# Patient Record
Sex: Male | Born: 1970 | Smoking: Current every day smoker
Health system: Southern US, Community
[De-identification: ages and names within clinical notes are randomized; demographics above are authoritative.]

## PROBLEM LIST (undated history)

## (undated) DIAGNOSIS — W3400XA Accidental discharge from unspecified firearms or gun, initial encounter: Secondary | ICD-10-CM

## (undated) HISTORY — PX: ABDOMINAL SURGERY: SHX537

---

## 2016-10-04 ENCOUNTER — Emergency Department (HOSPITAL_COMMUNITY)
Admission: EM | Admit: 2016-10-04 | Discharge: 2016-10-04 | Disposition: A | Discharge status: Home / Self Care | Payer: Self-pay | Attending: Emergency Medicine | Admitting: Emergency Medicine

## 2016-10-04 ENCOUNTER — Encounter (HOSPITAL_COMMUNITY): Payer: Self-pay | Admitting: Emergency Medicine

## 2016-10-04 DIAGNOSIS — M25561 Pain in right knee: Secondary | ICD-10-CM | POA: Insufficient documentation

## 2016-10-04 DIAGNOSIS — G8929 Other chronic pain: Secondary | ICD-10-CM | POA: Insufficient documentation
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
## 2016-10-04 NOTE — ED Triage Notes (Signed)
Patient here from home with complaints of right knee pain. Reports that he had x-rays done in [place], no fractures. I think I have "fluid" on the knee.

## 2017-01-06 ENCOUNTER — Other Ambulatory Visit: Payer: Self-pay

## 2017-01-06 ENCOUNTER — Emergency Department (HOSPITAL_COMMUNITY): Payer: Self-pay

## 2017-01-06 ENCOUNTER — Emergency Department (HOSPITAL_COMMUNITY)
Admission: EM | Admit: 2017-01-06 | Discharge: 2017-01-06 | Disposition: A | Discharge status: Home / Self Care | Payer: Self-pay | Attending: Emergency Medicine | Admitting: Emergency Medicine

## 2017-01-06 ENCOUNTER — Encounter (HOSPITAL_COMMUNITY): Payer: Self-pay

## 2017-01-06 DIAGNOSIS — Z79899 Other long term (current) drug therapy: Secondary | ICD-10-CM | POA: Insufficient documentation

## 2017-01-06 DIAGNOSIS — M791 Myalgia, unspecified site: Secondary | ICD-10-CM | POA: Insufficient documentation

## 2017-01-06 LAB — CBC
HCT: 41.3 % (ref 39.0–52.0)
HEMOGLOBIN: 14.9 g/dL (ref 13.0–17.0)
MCH: 34.3 pg — ABNORMAL HIGH (ref 26.0–34.0)
MCHC: 36.1 g/dL — ABNORMAL HIGH (ref 30.0–36.0)
MCV: 94.9 fL (ref 78.0–100.0)
PLATELETS: 273 10*3/uL (ref 150–400)
RBC: 4.35 MIL/uL (ref 4.22–5.81)
RDW: 13.7 % (ref 11.5–15.5)
WBC: 7.7 10*3/uL (ref 4.0–10.5)

## 2017-01-06 LAB — BASIC METABOLIC PANEL
ANION GAP: 9 (ref 5–15)
BUN: 6 mg/dL (ref 6–20)
CALCIUM: 9.3 mg/dL (ref 8.9–10.3)
CO2: 23 mmol/L (ref 22–32)
CREATININE: 0.83 mg/dL (ref 0.61–1.24)
Chloride: 106 mmol/L (ref 101–111)
Glucose, Bld: 93 mg/dL (ref 65–99)
Potassium: 3.5 mmol/L (ref 3.5–5.1)
SODIUM: 138 mmol/L (ref 135–145)

## 2017-01-06 LAB — I-STAT TROPONIN, ED: TROPONIN I, POC: 0 ng/mL (ref 0.00–0.08)

## 2017-01-06 MED ORDER — CYCLOBENZAPRINE HCL 10 MG PO TABS: 10.0000 mg | ORAL_TABLET | Freq: Every evening | ORAL | 0 refills | Status: AC | PRN

## 2017-01-06 MED ORDER — IBUPROFEN 800 MG PO TABS
800.0000 mg | ORAL_TABLET | Freq: Once | ORAL | Status: AC
  Administered 2017-01-06: 800 mg via ORAL
  Filled 2017-01-06: qty 1

## 2017-01-06 MED ORDER — IBUPROFEN 800 MG PO TABS: 800.0000 mg | ORAL_TABLET | Freq: Three times a day (TID) | ORAL | 0 refills | Status: AC
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
## 2017-01-06 NOTE — Discharge Instructions (Signed)
Take ibuprofen 3 times a day with meals.  Do not take other anti-inflammatories at the same time (Advil, Motrin, naproxen, Aleve).  You may supplement with Tylenol if you need further pain control. Use Flexeril as needed at night for muscle pain, stiffness, and soreness. Use the knee immobilizer as needed while walking.  This is for symptom control. You will likely have continued muscle stiffness and soreness over the next several days.  Follow-up with orthopedic doctor in 1 week if symptoms are not improving. Follow-up with primary care or workers comp if you have continued muscle pain. Return to the ER if you develop vision changes, vomiting, loss of bowel or bladder control, or any new or worsening symptoms.
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
## 2017-01-21 ENCOUNTER — Ambulatory Visit (INDEPENDENT_AMBULATORY_CARE_PROVIDER_SITE_OTHER): Payer: Self-pay | Admitting: Orthopedic Surgery
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
## 2019-09-22 ENCOUNTER — Encounter (HOSPITAL_COMMUNITY): Payer: Self-pay

## 2019-09-22 ENCOUNTER — Other Ambulatory Visit: Payer: Self-pay

## 2019-09-22 ENCOUNTER — Emergency Department (HOSPITAL_COMMUNITY)
Admission: EM | Admit: 2019-09-22 | Discharge: 2019-09-22 | Disposition: A | Discharge status: Home / Self Care | Payer: Self-pay | Attending: Emergency Medicine | Admitting: Emergency Medicine

## 2019-09-22 DIAGNOSIS — R221 Localized swelling, mass and lump, neck: Secondary | ICD-10-CM | POA: Insufficient documentation

## 2019-09-22 DIAGNOSIS — R55 Syncope and collapse: Secondary | ICD-10-CM | POA: Insufficient documentation

## 2019-09-22 DIAGNOSIS — Z5321 Procedure and treatment not carried out due to patient leaving prior to being seen by health care provider: Secondary | ICD-10-CM | POA: Insufficient documentation

## 2019-09-22 HISTORY — DX: Accidental discharge from unspecified firearms or gun, initial encounter: W34.00XA

## 2019-09-22 LAB — CBC
HCT: 44.4 % (ref 39.0–52.0)
Hemoglobin: 15.1 g/dL (ref 13.0–17.0)
MCH: 34 pg (ref 26.0–34.0)
MCHC: 34 g/dL (ref 30.0–36.0)
MCV: 100 fL (ref 80.0–100.0)
Platelets: 246 10*3/uL (ref 150–400)
RBC: 4.44 MIL/uL (ref 4.22–5.81)
RDW: 14.7 % (ref 11.5–15.5)
WBC: 5.9 10*3/uL (ref 4.0–10.5)
nRBC: 0 % (ref 0.0–0.2)

## 2019-09-22 LAB — CBG MONITORING, ED: Glucose-Capillary: 101 mg/dL — ABNORMAL HIGH (ref 70–99)

## 2019-09-22 LAB — BASIC METABOLIC PANEL
Anion gap: 12 (ref 5–15)
BUN: 7 mg/dL (ref 6–20)
CO2: 27 mmol/L (ref 22–32)
Calcium: 9.1 mg/dL (ref 8.9–10.3)
Chloride: 102 mmol/L (ref 98–111)
Creatinine, Ser: 1.13 mg/dL (ref 0.61–1.24)
GFR calc Af Amer: >60 mL/min (ref >60)
GFR calc non Af Amer: >60 mL/min (ref >60)
Glucose, Bld: 117 mg/dL — ABNORMAL HIGH (ref 70–99)
Potassium: 3.6 mmol/L (ref 3.5–5.1)
Sodium: 141 mmol/L (ref 135–145)

## 2019-09-22 MED ORDER — SODIUM CHLORIDE 0.9% FLUSH: 3.0000 mL | Freq: Once | INTRAVENOUS | Status: DC

## 2019-09-22 NOTE — ED Notes (Signed)
Pt called x3 over the course of 1 hour for updated v/s with no response. Triage RN aware.

## 2019-09-22 NOTE — ED Triage Notes (Addendum)
Per EMS- patient c/o neck swelling and diaphoresis after eating left overs from [facility]. Patient then went to the bathroom and had a syncopal episode. Patient continued to have profuse sweating when EMS arrived. Upon arrival patient states that he feels like the neck swelling has decreased  Patent able to swallow and breathe without difficulty. Patient  States, "I feel fine now."
